# Patient Record
Sex: Male | Born: 1995 | Race: Black or African American | Hispanic: No | Marital: Single | Smoking: Never smoker
Health system: Southern US, Community
[De-identification: ages and names within clinical notes are randomized; demographics above are authoritative.]

---

## 2015-06-20 ENCOUNTER — Emergency Department (HOSPITAL_COMMUNITY)
Admission: EM | Admit: 2015-06-20 | Discharge: 2015-06-20 | Disposition: A | Discharge status: Home / Self Care | Payer: Managed Care, Other (non HMO) | Attending: Emergency Medicine | Admitting: Emergency Medicine

## 2015-06-20 ENCOUNTER — Encounter (HOSPITAL_COMMUNITY): Payer: Self-pay

## 2015-06-20 DIAGNOSIS — Z23 Encounter for immunization: Secondary | ICD-10-CM | POA: Insufficient documentation

## 2015-06-20 DIAGNOSIS — L02412 Cutaneous abscess of left axilla: Secondary | ICD-10-CM | POA: Insufficient documentation

## 2015-06-20 DIAGNOSIS — L0291 Cutaneous abscess, unspecified: Secondary | ICD-10-CM

## 2015-06-20 MED ORDER — BUPIVACAINE-EPINEPHRINE (PF) 0.5% -1:200000 IJ SOLN
10.0000 mL | Freq: Once | INTRAMUSCULAR | Status: AC
  Administered 2015-06-20: 10 mL
  Filled 2015-06-20: qty 10

## 2015-06-20 MED ORDER — OXYCODONE-ACETAMINOPHEN 5-325 MG PO TABS: 1.0000 | ORAL_TABLET | ORAL | Status: DC | PRN

## 2015-06-20 MED ORDER — SULFAMETHOXAZOLE-TRIMETHOPRIM 800-160 MG PO TABS: 1.0000 | ORAL_TABLET | Freq: Two times a day (BID) | ORAL | Status: AC

## 2015-06-20 MED ORDER — NAPROXEN 500 MG PO TABS: 500.0000 mg | ORAL_TABLET | Freq: Two times a day (BID) | ORAL | Status: DC

## 2015-06-20 MED ORDER — TETANUS-DIPHTH-ACELL PERTUSSIS 5-2.5-18.5 LF-MCG/0.5 IM SUSP
0.5000 mL | Freq: Once | INTRAMUSCULAR | Status: AC
  Administered 2015-06-20: 0.5 mL via INTRAMUSCULAR
  Filled 2015-06-20: qty 0.5

## 2015-06-20 NOTE — Discharge Instructions (Signed)
You have been seen today for an abscess. This was successfully drained while here in the ED. You are also being prescribed an antibiotic. Please take all of your antibiotics until finished!   You may develop abdominal discomfort or diarrhea from the antibiotic.  You may help offset this with probiotics which you can buy or get in yogurt. Do not eat or take the probiotics until 2 hours after your antibiotic. Follow up with PCP as needed should symptoms continue. Return to ED should symptoms worsen.

## 2015-06-20 NOTE — ED Notes (Signed)
Patient here with left axilla abscess since Saturday, no drainage

## 2015-06-20 NOTE — ED Notes (Signed)
No marcaine in fast track or pod A.   Called pharmacy and spoke with MioBen.   He is to send to our tube.

## 2015-06-20 NOTE — ED Provider Notes (Signed)
CSN: [id number]     Arrival date & time 06/20/15  4098 History  By signing my name below, I, Renetta Chalk, attest that this documentation has been prepared under the direction and in the presence of Shawn Joy PA-C.  Electronically Signed: Renetta Chalk, ED Scribe. 06/17/2015. 4:01 PM.  Chief Complaint  Patient presents with  . Abscess   The history is provided by the patient. No language interpreter was used.   HPI Comments: Keith Henderson is a 20 y.o. male who presents to the Emergency Department complaining of a constant, gradually worsened area of pain and swelling to the left axilla that began 4 days ago. Pt describes the pain as 8/10, sharp, and nonradiating. Pt reports pain is aggravated with movement of the left arm. He has applied warm compresses with no relief. Pt denies drainage, fever, chills, nausea, vomiting, neuro deficits, or any other complaints.  History reviewed. No pertinent past medical history. History reviewed. No pertinent past surgical history. No family history on file. Social History  Substance Use Topics  . Smoking status: Never Smoker   . Smokeless tobacco: None  . Alcohol Use: None    Review of Systems  Constitutional: Negative for fever and chills.  Gastrointestinal: Negative for nausea and vomiting.  Skin: Positive for wound (abscess).  Neurological: Negative for weakness and numbness.   Allergies  Review of patient's allergies indicates no known allergies.  Home Medications   Prior to Admission medications   Medication Sig Start Date End Date Taking? Authorizing Provider  naproxen (NAPROSYN) 500 MG tablet Take 1 tablet (500 mg total) by mouth 2 (two) times daily. 06/20/15   Shawn C Joy, PA-C  oxyCODONE-acetaminophen (PERCOCET/ROXICET) 5-325 MG tablet Take 1 tablet by mouth every 4 (four) hours as needed for severe pain. 06/20/15   Shawn C Joy, PA-C  sulfamethoxazole-trimethoprim (BACTRIM DS,SEPTRA DS) 800-160 MG tablet Take 1 tablet by mouth 2 (two)  times daily. 06/20/15 06/27/15  Shawn C Joy, PA-C   BP 141/73 mmHg  Pulse 83  Temp(Src) 98 F (36.7 C) (Oral)  Resp 18  SpO2 99% Physical Exam  Constitutional: He is oriented to person, place, and time. He appears well-developed and well-nourished. No distress.  HENT:  Head: Normocephalic and atraumatic.  Eyes: Conjunctivae are normal.  Neck: Normal range of motion. Neck supple.  Cardiovascular: Normal rate, regular rhythm and intact distal pulses.   Pulmonary/Chest: Effort normal.  Musculoskeletal:  Full range of motion in the left arm and shoulder.  Neurological: He is alert and oriented to person, place, and time.  No sensory deficits. Strength 5 out of 5.  Skin: Skin is warm and dry. He is not diaphoretic.  5cm by 3cm abscess in left axilla, no drainage or surrounding erythema. Tenderness and fluctuance noted. No inflamed lymph nodes to left axilla.  Psychiatric: He has a normal mood and affect. His behavior is normal.  Nursing note and vitals reviewed.   ED Course  .Marland KitchenIncision and Drainage Date/Time: 06/20/2015 10:56 AM Performed by: Anselm Pancoast Authorized by: Harolyn Rutherford C Consent: Verbal consent obtained. Risks and benefits: risks, benefits and alternatives were discussed Consent given by: patient Patient understanding: patient states understanding of the procedure being performed Patient consent: the patient's understanding of the procedure matches consent given Procedure consent: procedure consent matches procedure scheduled Patient identity confirmed: verbally with patient and arm band Type: abscess Body area: trunk (left axilla) Anesthesia: local infiltration Local anesthetic: bupivacaine 0.5% with epinephrine Anesthetic total: 5 ml Patient sedated: no  Scalpel size: 11 Incision type: single straight Incision depth: subcutaneous Complexity: simple Drainage: purulent and  serosanguinous Drainage amount: copious Wound treatment: wound left open Packing material:  none Patient tolerance: Patient tolerated the procedure well with no immediate complications    DIAGNOSTIC STUDIES: Oxygen Saturation is 99% on RA, normal by my interpretation.  COORDINATION OF CARE: 9:52 AM Discussed treatment plan which includes incision and drainage with pt at bedside and pt agreed to plan.  EMERGENCY DEPARTMENT US SOFT TISSUE INTERPRETATION "Study: Limited Ultrasound of the noted body part in comments below"  INDICATIONS: Soft tissue infection Multiple views of the body part are obtained with a multi-frequency linear probe  PERFORMED BY:  Myself  IMAGES ARCHIVED?: Yes  SIDE:Left  BODY PART:Axilla  FINDINGS: Abcess present and Cellulitis absent  LIMITATIONS: None  INTERPRETATION:  Abcess present and No cellulitis noted  COMMENT:  Abscess without cellulitis   MDM   Final diagnoses:  Abscess   Keith Henderson presents with an abscess in the left axilla for the last 4 days.  I&D successful. Patient has no systemic symptoms or signs of sepsis. Prescribed Bactrim. Home care and return precautions discussed. Patient voiced understanding of these instructions and is comfortable with discharge.  Filed Vitals:   06/20/15 0930 06/20/15 1102  BP: 141/73 116/71  Pulse: 83 69  Temp: 98 F (36.7 C) 98.7 F (37.1 C)  TempSrc: Oral Oral  Resp: 18 20  SpO2: 99% 99%      Anselm PancoastShawn C Joy, PA-C 06/20/15 1803  Pricilla LovelessScott Goldston, MD 06/20/15 [phone]

## 2017-04-26 ENCOUNTER — Encounter (HOSPITAL_COMMUNITY): Payer: Self-pay | Admitting: Emergency Medicine

## 2017-04-26 ENCOUNTER — Other Ambulatory Visit: Payer: Self-pay

## 2017-04-26 ENCOUNTER — Emergency Department (HOSPITAL_COMMUNITY)
Admission: EM | Admit: 2017-04-26 | Discharge: 2017-04-26 | Disposition: A | Discharge status: Home / Self Care | Payer: Managed Care, Other (non HMO) | Attending: Emergency Medicine | Admitting: Emergency Medicine

## 2017-04-26 DIAGNOSIS — Z79899 Other long term (current) drug therapy: Secondary | ICD-10-CM | POA: Insufficient documentation

## 2017-04-26 DIAGNOSIS — L738 Other specified follicular disorders: Secondary | ICD-10-CM | POA: Insufficient documentation

## 2017-04-26 MED ORDER — MUPIROCIN 2 % EX OINT: 1.0000 "application " | TOPICAL_OINTMENT | Freq: Two times a day (BID) | CUTANEOUS | 0 refills | Status: DC

## 2017-04-26 NOTE — ED Provider Notes (Signed)
[facility]   CSN: [id number] Arrival date & time: 04/26/17  [id number]     History   Chief Complaint Chief Complaint  Patient presents with  . Rash    HPI  Keith Henderson is a 22 y.o. male here for evaluation of multiple bumps around his lips and mustache line for the last 2 days. Thinks that they are razor bumps. Admits to recently shaving right along his lip line 3 days ago, onset of bumps was 2 days. Reports local associated pain.Has been doing warm compresses but this is not helping the pain. Has also been squeezing the bumps with some white drainage. Denies fevers, chills, no lesions on his actual lips or intraoral cavity or anywhere else in his body.   HPI  History reviewed. No pertinent past medical history.  There are no active problems to display for this patient.   History reviewed. No pertinent surgical history.      Home Medications    Prior to Admission medications   Medication Sig Start Date End Date Taking? Authorizing Provider  mupirocin ointment (BACTROBAN) 2 % Place 1 application into the nose 2 (two) times daily. 04/26/17   Liberty HandyGibbons, July Linam J, PA-C  naproxen (NAPROSYN) 500 MG tablet Take 1 tablet (500 mg total) by mouth 2 (two) times daily. 06/20/15   Joy, Shawn C, PA-C  oxyCODONE-acetaminophen (PERCOCET/ROXICET) 5-325 MG tablet Take 1 tablet by mouth every 4 (four) hours as needed for severe pain. 06/20/15   Anselm PancoastJoy, Shawn C, PA-C    Family History No family history on file.  Social History Social History   Tobacco Use  . Smoking status: Never Smoker  . Smokeless tobacco: Never Used  Substance Use Topics  . Alcohol use: Not Currently  . Drug use: Yes    Types: Marijuana     Allergies   Patient has no known allergies.   Review of Systems Review of Systems  Skin:       Facial bumps  All other systems reviewed and are negative.    Physical Exam Updated Vital Signs BP 128/77 (BP Location:  Right Arm)   Pulse 92   Temp 98.2 F (36.8 C) (Oral)   Resp 16   Ht 5\' 9"  (1.753 m)   Wt 58.1 kg (128 lb)   SpO2 100%   BMI 18.90 kg/m   Physical Exam  Constitutional: He is oriented to person, place, and time. He appears well-developed and well-nourished.  Non-toxic appearance.  HENT:  Head: Normocephalic.  Right Ear: External ear normal.  Left Ear: External ear normal.  Nose: Nose normal.  No lesions to lips or intra oral cavity  Eyes: Conjunctivae and EOM are normal.  Neck: Full passive range of motion without pain.  Cardiovascular: Normal rate.  Pulmonary/Chest: Effort normal. No tachypnea. No respiratory distress.  Musculoskeletal: Normal range of motion.  Neurological: He is alert and oriented to person, place, and time.  Skin: Skin is warm and dry. Capillary refill takes less than 2 seconds.  Multiple pustular lesions around lip line and throughout mustache hair, mildly tender. No honey or yellow crusting, significant edema, warmth or induration.   Psychiatric: His behavior is normal. Thought content normal.     ED Treatments / Results  Labs (all labs ordered are listed, but only abnormal results are displayed) Labs Reviewed - No data to display  EKG None  Radiology No results found.  Procedures Procedures (including critical care time)  Medications Ordered in ED  Medications - No data to display   Initial Impression / Assessment and Plan / ED Course  I have reviewed the triage vital signs and the nursing notes.  Pertinent labs & imaging results that were available during my care of the patient were reviewed by me and considered in my medical decision making (see chart for details).     Hx and exam consistent with uncomplicated folliculitis barbae. No vesicular lesions in mucous membranes.  No solitary abscess or signs of cellulitis or impetigo. No indication for I&D today. Patient handed abx ointment in ED. Discharge with mupirocin prescription, warm  compress, NSAIDs for pain. Education provided on expected course of illness. Discussed return precautions.   Final Clinical Impressions(s) / ED Diagnoses   Final diagnoses:  Folliculitis barbae    ED Discharge Orders        Ordered    mupirocin ointment (BACTROBAN) 2 %  2 times daily     04/26/17 1610       Liberty Handy, PA-C 04/26/17 0930    Raeford Razor, MD 04/26/17 [phone]

## 2017-04-26 NOTE — ED Triage Notes (Signed)
Pt. Stated, I have some razor bumps on my lips for 2 days.

## 2017-04-26 NOTE — Discharge Instructions (Signed)
Bumps are from infected/inflamed hair follicles.   Massage under warm water or shower to help express pus, do this with clean hands only. Do not pick at skin with dirty hands. Can also do warm compresses during the day. Tap skin dry, apply a thin layer of antibiotic ointment (over the counter antibiotic ointment or cream) if pore is open and draining.  If you prefer you can use prescription antibiotic ointment (mupirocin), prescription given today. Can take tylenol or ibuprofen for associated pain.    Return if there is increased swelling, pain, redness, warmth, big boil or honey or yellow crusting over the pores, fevers, chills.

## 2018-10-13 ENCOUNTER — Emergency Department (HOSPITAL_COMMUNITY)
Admission: EM | Admit: 2018-10-13 | Discharge: 2018-10-13 | Disposition: A | Discharge status: Home / Self Care | Payer: No Typology Code available for payment source | Attending: Emergency Medicine | Admitting: Emergency Medicine

## 2018-10-13 ENCOUNTER — Encounter (HOSPITAL_COMMUNITY): Payer: Self-pay | Admitting: Emergency Medicine

## 2018-10-13 ENCOUNTER — Emergency Department (HOSPITAL_COMMUNITY): Payer: No Typology Code available for payment source

## 2018-10-13 ENCOUNTER — Other Ambulatory Visit: Payer: Self-pay

## 2018-10-13 DIAGNOSIS — Y93I9 Activity, other involving external motion: Secondary | ICD-10-CM | POA: Insufficient documentation

## 2018-10-13 DIAGNOSIS — M545 Low back pain: Secondary | ICD-10-CM | POA: Diagnosis not present

## 2018-10-13 DIAGNOSIS — Z79899 Other long term (current) drug therapy: Secondary | ICD-10-CM | POA: Insufficient documentation

## 2018-10-13 DIAGNOSIS — Y92410 Unspecified street and highway as the place of occurrence of the external cause: Secondary | ICD-10-CM | POA: Insufficient documentation

## 2018-10-13 DIAGNOSIS — Y999 Unspecified external cause status: Secondary | ICD-10-CM | POA: Insufficient documentation

## 2018-10-13 DIAGNOSIS — S29012A Strain of muscle and tendon of back wall of thorax, initial encounter: Secondary | ICD-10-CM | POA: Diagnosis not present

## 2018-10-13 DIAGNOSIS — S299XXA Unspecified injury of thorax, initial encounter: Secondary | ICD-10-CM | POA: Diagnosis present

## 2018-10-13 LAB — URINALYSIS, ROUTINE W REFLEX MICROSCOPIC
Bilirubin Urine: NEGATIVE
Glucose, UA: NEGATIVE mg/dL
Hgb urine dipstick: NEGATIVE
Ketones, ur: NEGATIVE mg/dL
Nitrite: NEGATIVE
Protein, ur: NEGATIVE mg/dL
Specific Gravity, Urine: 1.027 (ref 1.005–1.030)
pH: 6 (ref 5.0–8.0)

## 2018-10-13 MED ORDER — CYCLOBENZAPRINE HCL 10 MG PO TABS: 10.0000 mg | ORAL_TABLET | Freq: Three times a day (TID) | ORAL | 0 refills | Status: DC | PRN

## 2018-10-13 MED ORDER — IBUPROFEN 600 MG PO TABS: 600.0000 mg | ORAL_TABLET | Freq: Four times a day (QID) | ORAL | 0 refills | Status: DC | PRN

## 2018-10-13 MED ORDER — IBUPROFEN 400 MG PO TABS
600.0000 mg | ORAL_TABLET | Freq: Once | ORAL | Status: AC
  Administered 2018-10-13: 600 mg via ORAL
  Filled 2018-10-13: qty 1

## 2018-10-13 NOTE — Discharge Instructions (Addendum)
X-rays of your spine were normal.  Urine studies normal as well.  You have muscle strain of the back.  Would use warm moist heat or heating pad for 20 minutes 3 times daily.  Take ibuprofen 600 mg every 6-8 hours as needed as first-line medication for pain.  If needed for muscle relaxation may take the Flexeril 3 times daily as needed for 5 days.  Follow-up with your regular physician if pain persists after 5 days.  Return to ED sooner for new abdominal pain with vomiting breathing difficulty, weakness in your legs bowel or bladder incontinence or new concerns.
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
## 2018-10-13 NOTE — ED Notes (Signed)
Patient to xray via w/c.
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
## 2023-02-24 ENCOUNTER — Encounter (HOSPITAL_COMMUNITY): Payer: Self-pay

## 2023-02-24 ENCOUNTER — Ambulatory Visit (HOSPITAL_COMMUNITY)
Admission: EM | Admit: 2023-02-24 | Discharge: 2023-02-24 | Disposition: A | Discharge status: Home / Self Care | Payer: BC Managed Care – PPO | Attending: Family Medicine | Admitting: Family Medicine

## 2023-02-24 DIAGNOSIS — J101 Influenza due to other identified influenza virus with other respiratory manifestations: Secondary | ICD-10-CM | POA: Diagnosis not present

## 2023-02-24 LAB — POCT INFLUENZA A/B
Influenza A, POC: POSITIVE — AB
Influenza B, POC: NEGATIVE

## 2023-02-24 MED ORDER — ACETAMINOPHEN 325 MG PO TABS
ORAL_TABLET | ORAL | Status: AC
  Filled 2023-02-24: qty 2

## 2023-02-24 MED ORDER — OSELTAMIVIR PHOSPHATE 75 MG PO CAPS: 75.0000 mg | ORAL_CAPSULE | Freq: Two times a day (BID) | ORAL | 0 refills | Status: AC

## 2023-02-24 MED ORDER — ACETAMINOPHEN 325 MG PO TABS
650.0000 mg | ORAL_TABLET | Freq: Once | ORAL | Status: AC
  Administered 2023-02-24: 650 mg via ORAL

## 2023-02-24 NOTE — Discharge Instructions (Addendum)
 Your testing was positive for flu A  The goal of treatment at this time is to reduce your symptoms and discomfort   You can use over the counter medications such as Dayquil/Nyquil, AlkaSeltzer formulations, etc to provide further relief of symptoms according to the manufacturer's instructions   I have sent in tamiflu for you to take for the flu. This is most effective when started within 48 hours of symptom onset. Please take as directed for the full course unless you develop side effects or an allergic reaction   Please make sure that you are increasing your fluid intake and staying well-hydrated.  If you do not feel like eating you can try supplementing with meal replacement shakes/drinks such as Ensure or you can use protein smoothies or Gatorade/Pedialyte as desired.  If your symptoms do not improve or become worse in the next 5-7 days follow-up with your PCP or return to urgent care. Go to the ER if you begin to have more serious symptoms such as shortness of breath, trouble breathing, loss of consciousness, swelling around the eyes, high fever, severe lasting headaches, vision changes or neck pain/stiffness.

## 2023-02-24 NOTE — ED Triage Notes (Signed)
Pt c/o cough, fatigue, headache, nasal congestion, and body aches x2 days. Denies taking any meds.

## 2023-02-24 NOTE — ED Provider Notes (Signed)
 [facility]    CSN: [id number] Arrival date & time: 02/24/23  1721      History   Chief Complaint Chief Complaint  Patient presents with   Cough   Fever    HPI Keith Henderson is a 28 y.o. male.   HPI   He reports he has been feeling sick since Sunday  He reports he has been having fevers, persistent hiccups, body aches, headaches  He denies known recent sick contacts or travel  Interventions: nothing      History reviewed. No pertinent past medical history.  There are no active problems to display for this patient.   History reviewed. No pertinent surgical history.     Home Medications    Prior to Admission medications   Medication Sig Start Date End Date Taking? Authorizing Provider  oseltamivir  (TAMIFLU ) 75 MG capsule Take 1 capsule (75 mg total) by mouth 2 (two) times daily for 5 days. 02/24/23 03/01/23 Yes Gunner Iodice, Rocky BRAVO, PA-C    Family History History reviewed. No pertinent family history.  Social History Social History   Tobacco Use   Smoking status: Never   Smokeless tobacco: Never  Substance Use Topics   Alcohol use: Not Currently   Drug use: Yes    Types: Marijuana     Allergies   Patient has no known allergies.   Review of Systems Review of Systems  Constitutional:  Positive for chills and fever.  HENT:  Positive for postnasal drip and rhinorrhea. Negative for congestion, ear pain and sore throat.   Respiratory:  Positive for cough. Negative for shortness of breath and wheezing.   Gastrointestinal:  Negative for diarrhea, nausea and vomiting.  Musculoskeletal:  Positive for myalgias.  Neurological:  Positive for headaches.     Physical Exam Triage Vital Signs ED Triage Vitals  Encounter Vitals Group     BP 02/24/23 1820 (!) 143/75     Systolic BP Percentile --      Diastolic BP Percentile --      Pulse Rate 02/24/23 1820 (!) 132     Resp 02/24/23 1820 18     Temp 02/24/23 1820 (!) 103.1 F (39.5 C)     Temp  Source 02/24/23 1820 Oral     SpO2 02/24/23 1820 94 %     Weight --      Height --      Head Circumference --      Peak Flow --      Pain Score 02/24/23 1821 8     Pain Loc --      Pain Education --      Exclude from Growth Chart --    No data found.  Updated Vital Signs BP (!) 143/75 (BP Location: Right Arm)   Pulse (!) 132   Temp (!) 103.1 F (39.5 C) (Oral)   Resp 18   SpO2 94%   Visual Acuity Right Eye Distance:   Left Eye Distance:   Bilateral Distance:    Right Eye Near:   Left Eye Near:    Bilateral Near:     Physical Exam Vitals reviewed.  Constitutional:      General: He is awake.     Appearance: Normal appearance. He is well-developed and well-groomed.  HENT:     Head: Normocephalic and atraumatic.     Right Ear: Hearing, tympanic membrane and ear canal normal.     Left Ear: Hearing, tympanic membrane and ear canal normal.     Mouth/Throat:  Lips: Pink.     Mouth: Mucous membranes are moist.     Pharynx: Oropharynx is clear. Uvula midline. No pharyngeal swelling, oropharyngeal exudate, posterior oropharyngeal erythema, uvula swelling or postnasal drip.     Tonsils: No tonsillar exudate or tonsillar abscesses.  Eyes:     General: Lids are normal. Gaze aligned appropriately.     Extraocular Movements: Extraocular movements intact.  Cardiovascular:     Rate and Rhythm: Regular rhythm. Tachycardia present.     Heart sounds: Normal heart sounds.  Pulmonary:     Effort: Pulmonary effort is normal.     Breath sounds: Normal breath sounds. No decreased air movement. No decreased breath sounds, wheezing, rhonchi or rales.  Musculoskeletal:     Cervical back: Normal range of motion and neck supple.  Lymphadenopathy:     Head:     Right side of head: No submental, submandibular or preauricular adenopathy.     Left side of head: No submental, submandibular or preauricular adenopathy.     Cervical:     Right cervical: No superficial cervical adenopathy.     Left cervical: No superficial cervical adenopathy.     Upper Body:     Right upper body: No supraclavicular adenopathy.     Left upper body: No supraclavicular adenopathy.  Skin:    General: Skin is warm and dry.  Neurological:     General: No focal deficit present.     Mental Status: He is alert and oriented to person, place, and time.  Psychiatric:        Mood and Affect: Mood normal.        Behavior: Behavior normal. Behavior is cooperative.        Thought Content: Thought content normal.        Judgment: Judgment normal.      UC Treatments / Results  Labs (all labs ordered are listed, but only abnormal results are displayed) Labs Reviewed  POCT INFLUENZA A/B - Abnormal; Notable for the following components:      Result Value   Influenza A, POC Positive (*)    All other components within normal limits    EKG   Radiology No results found.  Procedures Procedures (including critical care time)  Medications Ordered in UC Medications  acetaminophen  (TYLENOL ) tablet 650 mg (650 mg Oral Given 02/24/23 1827)    Initial Impression / Assessment and Plan / UC Course  I have reviewed the triage vital signs and the nursing notes.  Pertinent labs & imaging results that were available during my care of the patient were reviewed by me and considered in my medical decision making (see chart for details).      Final Clinical Impressions(s) / UC Diagnoses   Final diagnoses:  Influenza A   Acute, new concern Patient reports symptoms comprised of headaches, body aches, fever, chills, nasal congestion, cough that started on Sunday.  His rapid flu testing was positive for flu today.  Will send in Tamiflu  for management.  Reviewed side effects and mechanism of action with him and he is still amenable to proceeding with Tamiflu  regimen. Reviewed over-the-counter medications for symptomatic management.  Recommend multisymptom medication such as DayQuil/NyQuil, Alka-Seltzer, TheraFlu  per preference.  ED and return precautions provided in after visit summary.  Follow-up as needed   Discharge Instructions      Your testing was positive for flu A  The goal of treatment at this time is to reduce your symptoms and discomfort   You can  use over the counter medications such as Dayquil/Nyquil, AlkaSeltzer formulations, etc to provide further relief of symptoms according to the manufacturer's instructions   I have sent in tamiflu  for you to take for the flu. This is most effective when started within 48 hours of symptom onset. Please take as directed for the full course unless you develop side effects or an allergic reaction   Please make sure that you are increasing your fluid intake and staying well-hydrated.  If you do not feel like eating you can try supplementing with meal replacement shakes/drinks such as Ensure or you can use protein smoothies or Gatorade/Pedialyte as desired.  If your symptoms do not improve or become worse in the next 5-7 days follow-up with your PCP or return to urgent care. Go to the ER if you begin to have more serious symptoms such as shortness of breath, trouble breathing, loss of consciousness, swelling around the eyes, high fever, severe lasting headaches, vision changes or neck pain/stiffness.       ED Prescriptions     Medication Sig Dispense Auth. Provider   oseltamivir  (TAMIFLU ) 75 MG capsule Take 1 capsule (75 mg total) by mouth 2 (two) times daily for 5 days. 10 capsule Nacole Fluhr E, PA-C      PDMP not reviewed this encounter.   Marylene Rocky FORBES DEVONNA 02/24/23 [id number]
